# Patient Record
Sex: Male | Born: 1980 | Hispanic: No | Marital: Married | Smoking: Current every day smoker
Health system: Southern US, Community
[De-identification: ages and names within clinical notes are randomized; demographics above are authoritative.]

## PROBLEM LIST (undated history)

## (undated) DIAGNOSIS — K529 Noninfective gastroenteritis and colitis, unspecified: Secondary | ICD-10-CM

---

## 2009-03-16 ENCOUNTER — Encounter: Admission: RE | Admit: 2009-03-16 | Discharge: 2009-03-16 | Payer: Self-pay | Admitting: Infectious Diseases

## 2013-07-18 ENCOUNTER — Encounter (HOSPITAL_COMMUNITY): Payer: Self-pay | Admitting: Emergency Medicine

## 2013-07-18 ENCOUNTER — Emergency Department (HOSPITAL_COMMUNITY)
Admission: EM | Admit: 2013-07-18 | Discharge: 2013-07-18 | Disposition: A | Discharge status: Home / Self Care | Payer: BC Managed Care – PPO | Attending: Emergency Medicine | Admitting: Emergency Medicine

## 2013-07-18 ENCOUNTER — Emergency Department (HOSPITAL_COMMUNITY): Payer: BC Managed Care – PPO

## 2013-07-18 DIAGNOSIS — R509 Fever, unspecified: Secondary | ICD-10-CM | POA: Insufficient documentation

## 2013-07-18 DIAGNOSIS — Z8719 Personal history of other diseases of the digestive system: Secondary | ICD-10-CM | POA: Insufficient documentation

## 2013-07-18 DIAGNOSIS — R109 Unspecified abdominal pain: Secondary | ICD-10-CM | POA: Insufficient documentation

## 2013-07-18 DIAGNOSIS — K529 Noninfective gastroenteritis and colitis, unspecified: Secondary | ICD-10-CM

## 2013-07-18 DIAGNOSIS — R112 Nausea with vomiting, unspecified: Secondary | ICD-10-CM | POA: Insufficient documentation

## 2013-07-18 LAB — URINALYSIS, ROUTINE W REFLEX MICROSCOPIC
Ketones, ur: NEGATIVE mg/dL
Leukocytes, UA: NEGATIVE
Nitrite: NEGATIVE
Protein, ur: NEGATIVE mg/dL
Urobilinogen, UA: 0.2 mg/dL (ref 0.0–1.0)
pH: 6.5 (ref 5.0–8.0)

## 2013-07-18 LAB — CBC WITH DIFFERENTIAL/PLATELET
Basophils Absolute: 0 10*3/uL (ref 0.0–0.1)
Basophils Relative: 0 % (ref 0–1)
Eosinophils Absolute: 0 10*3/uL (ref 0.0–0.7)
HCT: 46.7 % (ref 39.0–52.0)
MCHC: 33.2 g/dL (ref 30.0–36.0)
Monocytes Absolute: 0.8 10*3/uL (ref 0.1–1.0)
Neutro Abs: 14.5 10*3/uL — ABNORMAL HIGH (ref 1.7–7.7)
Neutrophils Relative %: 87 % — ABNORMAL HIGH (ref 43–77)
RDW: 13 % (ref 11.5–15.5)

## 2013-07-18 LAB — HEPATIC FUNCTION PANEL
AST: 24 U/L (ref 0–37)
Bilirubin, Direct: 0.1 mg/dL (ref 0.0–0.3)
Indirect Bilirubin: 0.5 mg/dL (ref 0.3–0.9)
Total Bilirubin: 0.6 mg/dL (ref 0.3–1.2)

## 2013-07-18 LAB — BASIC METABOLIC PANEL
Chloride: 94 mEq/L — ABNORMAL LOW (ref 96–112)
Creatinine, Ser: 0.99 mg/dL (ref 0.50–1.35)
GFR calc Af Amer: >90 mL/min (ref >90)
GFR calc non Af Amer: >90 mL/min (ref >90)

## 2013-07-18 LAB — LIPASE, BLOOD: Lipase: 21 U/L (ref 11–59)

## 2013-07-18 MED ORDER — SODIUM CHLORIDE 0.9 % IV BOLUS (SEPSIS)
1000.0000 mL | Freq: Once | INTRAVENOUS | Status: AC
  Administered 2013-07-18: 1000 mL via INTRAVENOUS

## 2013-07-18 MED ORDER — IOHEXOL 300 MG/ML  SOLN
50.0000 mL | Freq: Once | INTRAMUSCULAR | Status: AC | PRN
  Administered 2013-07-18: 50 mL via ORAL

## 2013-07-18 MED ORDER — METRONIDAZOLE IN NACL 5-0.79 MG/ML-% IV SOLN
500.0000 mg | Freq: Once | INTRAVENOUS | Status: AC
  Administered 2013-07-18: 500 mg via INTRAVENOUS
  Filled 2013-07-18: qty 100

## 2013-07-18 MED ORDER — IOHEXOL 300 MG/ML  SOLN
100.0000 mL | Freq: Once | INTRAMUSCULAR | Status: AC | PRN
  Administered 2013-07-18: 100 mL via INTRAVENOUS

## 2013-07-18 MED ORDER — ACETAMINOPHEN 325 MG PO TABS
650.0000 mg | ORAL_TABLET | Freq: Once | ORAL | Status: AC
  Administered 2013-07-18: 650 mg via ORAL
  Filled 2013-07-18: qty 2

## 2013-07-18 MED ORDER — CIPROFLOXACIN IN D5W 400 MG/200ML IV SOLN
400.0000 mg | Freq: Once | INTRAVENOUS | Status: AC
  Administered 2013-07-18: 400 mg via INTRAVENOUS
  Filled 2013-07-18 (×2): qty 200

## 2013-07-18 MED ORDER — CIPROFLOXACIN HCL 500 MG PO TABS: 500.0000 mg | ORAL_TABLET | Freq: Two times a day (BID) | ORAL | Status: AC

## 2013-07-18 MED ORDER — SODIUM CHLORIDE 0.9 % IV SOLN
Freq: Once | INTRAVENOUS | Status: AC
  Administered 2013-07-18: 17:00:00 via INTRAVENOUS

## 2013-07-18 MED ORDER — PREDNISONE 20 MG PO TABS: ORAL_TABLET | ORAL | Status: AC

## 2013-07-18 MED ORDER — METRONIDAZOLE 500 MG PO TABS: 500.0000 mg | ORAL_TABLET | Freq: Three times a day (TID) | ORAL | Status: AC

## 2013-07-18 NOTE — ED Provider Notes (Signed)
CSN: [id number]     Arrival date & time 07/18/13  1124 History   First MD Initiated Contact with Patient 07/18/13 1302     Chief Complaint  Patient presents with  . Abdominal Pain  . Nausea  . Emesis   (Consider location/radiation/quality/duration/timing/severity/associated sxs/prior Treatment) The history is provided by the patient and medical records. The history is limited by a language barrier. A language interpreter was used.   This is a 32 year old male with no significant past history presented to the ED from urgent care for further evaluation of abdominal pain.  Patient began having generalized abdominal pain, nausea, and fever 2 days ago. Pain is now localized to periumbilical, RLQ, and LLQ. Patient states nausea has resolved.  No vomiting.  No recent sick contacts.  Endorses subjective fever and chills.  No prior abdominal surgeries.  Not on any daily medications.  Pain rated 1-2 on arrival to the ED.  VS stable.  History reviewed. No pertinent past medical history. History reviewed. No pertinent past surgical history. No family history on file. History  Substance Use Topics  . Smoking status: Not on file  . Smokeless tobacco: Not on file  . Alcohol Use: Not on file    Review of Systems  Constitutional: Positive for fever.  Gastrointestinal: Positive for abdominal pain.  All other systems reviewed and are negative.    Allergies  Review of patient's allergies indicates no known allergies.  Home Medications   Current Outpatient Rx  Name  Route  Sig  Dispense  Refill  . DM-Doxylamine-Acetaminophen (NYQUIL COLD & FLU PO)   Oral   Take 2 capsules by mouth at bedtime.          BP 119/73  Pulse 107  Temp(Src) 99.1 F (37.3 C) (Oral)  Resp 20  SpO2 100%  Physical Exam  Nursing note and vitals reviewed. Constitutional: He is oriented to person, place, and time. He appears well-developed and well-nourished. No distress.  Warm to touch  HENT:  Head:  Normocephalic and atraumatic.  Mouth/Throat: Oropharynx is clear and moist.  Eyes: Conjunctivae and EOM are normal. Pupils are equal, round, and reactive to light.  Neck: Normal range of motion.  Cardiovascular: Normal rate, regular rhythm and normal heart sounds.   Pulmonary/Chest: Effort normal and breath sounds normal. No respiratory distress. He has no wheezes.  Abdominal: Soft. Bowel sounds are normal. There is tenderness in the right lower quadrant and left lower quadrant. There is no CVA tenderness.  Abdomen soft, non-distended, TTP RLQ and LLQ  Musculoskeletal: Normal range of motion.  Neurological: He is alert and oriented to person, place, and time.  Skin: Skin is warm and dry. He is not diaphoretic.  Psychiatric: He has a normal mood and affect.    ED Course  Procedures (including critical care time) Labs Review Labs Reviewed  CBC WITH DIFFERENTIAL - Abnormal; Notable for the following:    WBC 16.7 (*)    RBC 5.91 (*)    Neutrophils Relative % 87 (*)    Neutro Abs 14.5 (*)    Lymphocytes Relative 9 (*)    All other components within normal limits  BASIC METABOLIC PANEL - Abnormal; Notable for the following:    Sodium 134 (*)    Chloride 94 (*)    Glucose, Bld 117 (*)    All other components within normal limits  HEPATIC FUNCTION PANEL - Abnormal; Notable for the following:    Total Protein 8.8 (*)    All other  components within normal limits  URINALYSIS, ROUTINE W REFLEX MICROSCOPIC  LIPASE, BLOOD   Imaging Review No results found.  EKG Interpretation   None       MDM  No diagnosis found.  Labs as above, leukocytosis at 16.7 with left shift.  CT abd/pelvis pending at this time.  Care signed out to PA Sciacca-- will follow results and dispo appropriately.  Garlon Hatchet, PA-C 07/18/13 [phone]

## 2013-07-18 NOTE — ED Provider Notes (Signed)
Medical screening examination/treatment/procedure(s) were conducted as a shared visit with non-physician practitioner(s) and myself.  I personally evaluated the patient during the encounter.  EKG Interpretation   None       Given abdominal pain the patient CT scan performed to evaluate further for pathology  Lyanne Co, MD 07/18/13 [phone]

## 2013-07-18 NOTE — ED Provider Notes (Signed)
Discussed case with Sharilyn Sites, PA-C. Transfer of care from Sharilyn Sites, PA-C at change in shift.  Miguel Stone is a 32 y/o M with no significant past medical history presenting to the ED with abdominal pain, nausea, and emesis.   Urine negative for infection - negative nitrites, pyuria, or leukocytes identified. CBC noted elevated WBC of 16.7 with a left shift. CMP noted mild hyponatremia of 134. Liver panel negative findings. Lipase negative elevation. CT scan of abdomen and pelvis noted colitis involving the mid to distal ascending colon and proximal portion of the transverse colon - appendix normal, negative findings for abscess.  Patient placed on IV fluids, IV cipro, and IV flagyl Negative findings for appendicitis.  Results for orders placed during the hospital encounter of 07/18/13  CBC WITH DIFFERENTIAL      Result Value Range   WBC 16.7 (*) 4.0 - 10.5 K/uL   RBC 5.91 (*) 4.22 - 5.81 MIL/uL   Hemoglobin 15.5  13.0 - 17.0 g/dL   HCT 16.1  09.6 - 04.5 %   MCV 79.0  78.0 - 100.0 fL   MCH 26.2  26.0 - 34.0 pg   MCHC 33.2  30.0 - 36.0 g/dL   RDW 40.9  81.1 - 91.4 %   Platelets 268  150 - 400 K/uL   Neutrophils Relative % 87 (*) 43 - 77 %   Neutro Abs 14.5 (*) 1.7 - 7.7 K/uL   Lymphocytes Relative 9 (*) 12 - 46 %   Lymphs Abs 1.4  0.7 - 4.0 K/uL   Monocytes Relative 5  3 - 12 %   Monocytes Absolute 0.8  0.1 - 1.0 K/uL   Eosinophils Relative 0  0 - 5 %   Eosinophils Absolute 0.0  0.0 - 0.7 K/uL   Basophils Relative 0  0 - 1 %   Basophils Absolute 0.0  0.0 - 0.1 K/uL  BASIC METABOLIC PANEL      Result Value Range   Sodium 134 (*) 135 - 145 mEq/L   Potassium 4.2  3.5 - 5.1 mEq/L   Chloride 94 (*) 96 - 112 mEq/L   CO2 26  19 - 32 mEq/L   Glucose, Bld 117 (*) 70 - 99 mg/dL   BUN 18  6 - 23 mg/dL   Creatinine, Ser 7.82  0.50 - 1.35 mg/dL   Calcium 9.8  8.4 - 95.6 mg/dL   GFR calc non Af Amer >90  >90 mL/min   GFR calc Af Amer >90  >90 mL/min  URINALYSIS, ROUTINE W REFLEX  MICROSCOPIC      Result Value Range   Color, Urine YELLOW  YELLOW   APPearance CLEAR  CLEAR   Specific Gravity, Urine 1.020  1.005 - 1.030   pH 6.5  5.0 - 8.0   Glucose, UA NEGATIVE  NEGATIVE mg/dL   Hgb urine dipstick NEGATIVE  NEGATIVE   Bilirubin Urine NEGATIVE  NEGATIVE   Ketones, ur NEGATIVE  NEGATIVE mg/dL   Protein, ur NEGATIVE  NEGATIVE mg/dL   Urobilinogen, UA 0.2  0.0 - 1.0 mg/dL   Nitrite NEGATIVE  NEGATIVE   Leukocytes, UA NEGATIVE  NEGATIVE  HEPATIC FUNCTION PANEL      Result Value Range   Total Protein 8.8 (*) 6.0 - 8.3 g/dL   Albumin 4.3  3.5 - 5.2 g/dL   AST 24  0 - 37 U/L   ALT 42  0 - 53 U/L   Alkaline Phosphatase 89  39 - 117 U/L  Total Bilirubin 0.6  0.3 - 1.2 mg/dL   Bilirubin, Direct 0.1  0.0 - 0.3 mg/dL   Indirect Bilirubin 0.5  0.3 - 0.9 mg/dL  LIPASE, BLOOD      Result Value Range   Lipase 21  11 - 59 U/L   Ct Abdomen Pelvis W Contrast  07/18/2013   CLINICAL DATA:  Right lower quadrant pain.  EXAM: CT ABDOMEN AND PELVIS WITH CONTRAST  TECHNIQUE: Multidetector CT imaging of the abdomen and pelvis was performed using the standard protocol following bolus administration of intravenous contrast.  CONTRAST:  OMNIPAQUE IOHEXOL 300 MG/ML  SOLN  COMPARISON:  None.  FINDINGS: Hypoventilation is appreciated within the lung bases.  The liver, spleen, adrenals, pancreas, kidneys are unremarkable.  There is diffuse bowel wall thickening involving the mid to distal ascending colon extending into the hepatic flexure and proximal transverse colon regions. Inflammatory changes identified within the surrounding mesenteric fat. There is no evidence of free fluid or loculated fluid collections. The appendix is appreciated images 57 through 61 series 2 and is unremarkable. Small subcentimeter lymph nodes are identified within the right pericolic gutter region. Subcentimeter lymph nodes are also appreciated adjacent to the distal aspect of the ascending colon. There is no  evidence of associated loculated fluid collections, nor pneumoperitoneum. A small amount of stool is appreciated within the colon.  The celiac, SMA, IMA, portal vein, SMV are opacified. There is no evidence of abdominal aortic aneurysm nor dissection. There is no evidence of bowel obstruction. There is no evidence of abdominal or pelvic masses no pathologic sized adenopathy. There is mild diverticulosis within the sigmoid colon. There is no evidence of pelvic free fluid or loculated fluid collections.  IMPRESSION: Colitis involving the mid to distal ascending colon and proximal portion of the transverse colon. Differential considerations are infectious versus inflammatory. Small reactive lymph nodes are identified adjacent to areas of colon. There is no evidence of pneumoperitoneum no evidence raise concern of abscess. The appendix is appreciated and is unremarkable.   Electronically Signed   By: Salome Holmes M.D.   On: 07/18/2013 15:52    5:59 PM This provider spoke with attending physician regarding case, presentation, history, labs and imaging. Dr. Earna Coder recommended clear liquid, PO challenge, to see if patient can tolerate, if patient stable and can tolerate PO can be discharged. If patient cannot tolerate - patient to be admitted.   7:59 PM IV medications and fluids completed. Patient tolerated fluids PO. This provider re-assessed patient. Translator used, [place] Patient reported that he is feeling better. Discussed with patient labs and imaging. Educated patient on what to watch out for. Discussed with patient discharge instructions. Discussed with patient who to follow-up with. Gave strict return instructions to patient. BS normoactive in all quadrants. Negative pain upon palpation - negative guarding noted. Negative acute abdomen. Vitals re-checked, temperature of 102.9 degrees fahrenheit - secondary to fever, suspicion to be higher for infectious then ischemic course. Discussed with Dr. Earna Coder case - as per physician cleared patient for discharge. Discussed with patient to stick with clear diet. Discussed with patient to take Tylenol as needed for fever control. Discussed with patient to take antibiotics as prescribed. Referred to GI. Discussed with patient to closely monitor symptoms and if symptoms are to worsen or change to report back to the ED - strict instructions given. Patient agreed to plan of care, understood, all questions answered.   Medications  acetaminophen (TYLENOL) tablet 650 mg (not administered)  iohexol (OMNIPAQUE) 300 MG/ML solution 50 mL (50 mLs Oral Contrast Given 07/18/13 1356)  iohexol (OMNIPAQUE) 300 MG/ML solution 100 mL (100 mLs Intravenous Contrast Given 07/18/13 1527)  sodium chloride 0.9 % bolus 1,000 mL (0 mLs Intravenous Stopped 07/18/13 1725)  ciprofloxacin (CIPRO) IVPB 400 mg (0 mg Intravenous Stopped 07/18/13 1846)  metroNIDAZOLE (FLAGYL) IVPB 500 mg (0 mg Intravenous Stopped 07/18/13 1745)  0.9 %  sodium chloride infusion ( Intravenous New Bag/Given 07/18/13 1640)   Filed Vitals:   07/18/13 1220 07/18/13 1547 07/18/13 1917 07/18/13 1954  BP: 119/73 132/63 119/65 118/70  Pulse: 107 107 108 115  Temp: 99.1 F (37.3 C) 98.9 F (37.2 C) 98.7 F (37.1 C) 102.9 F (39.4 C)  TempSrc: Oral Oral Oral Oral  Resp: 20 19 18 22   SpO2: 100% 99% 100% 98%   Diagnoses that have been ruled out:  None  Diagnoses that are still under consideration:  None  Final diagnoses:  Colitis     Raymon Mutton, PA-C 07/19/13 1439

## 2013-07-18 NOTE — ED Notes (Signed)
Pt is brought in sent over from urgent care with abd pain n/v fever they want [place] to R/O apendicitis.

## 2013-07-18 NOTE — ED Notes (Signed)
He will call when he has to urinate.

## 2013-07-20 ENCOUNTER — Emergency Department (HOSPITAL_COMMUNITY)
Admission: EM | Admit: 2013-07-20 | Discharge: 2013-07-20 | Discharge status: Against Medical Advice | Payer: BC Managed Care – PPO | Attending: Emergency Medicine | Admitting: Emergency Medicine

## 2013-07-20 ENCOUNTER — Encounter: Payer: Self-pay | Admitting: Internal Medicine

## 2013-07-20 ENCOUNTER — Encounter (HOSPITAL_COMMUNITY): Payer: Self-pay | Admitting: Emergency Medicine

## 2013-07-20 DIAGNOSIS — Z139 Encounter for screening, unspecified: Secondary | ICD-10-CM

## 2013-07-20 DIAGNOSIS — R197 Diarrhea, unspecified: Secondary | ICD-10-CM | POA: Insufficient documentation

## 2013-07-20 DIAGNOSIS — Z8719 Personal history of other diseases of the digestive system: Secondary | ICD-10-CM | POA: Insufficient documentation

## 2013-07-20 HISTORY — DX: Noninfective gastroenteritis and colitis, unspecified: K52.9

## 2013-07-20 NOTE — Progress Notes (Signed)
   CARE MANAGEMENT ED NOTE 07/20/2013  Patient:  RAYWOOD, WAILES   Account Number:  [id number]  Date Initiated:  07/20/2013  Documentation initiated by:  Edd Arbour  Subjective/Objective Assessment:   32 yr old male bcbs [place] ppo with 2 ed visits in last month for abdominal pain Colitis found during 07/18/09 ED visit Pt reports taking in liquid foods     Subjective/Objective Assessment Detail:   Beverley Fiedler, MD On 08/24/2013 your appointment is on August 24 2013 at 8:30 in the morning with Dr Vonna Kotyk [place]  [place] [address] [phone]     Action/Plan:   CM spoke with ED RN Renita Cm spoke with ED charge RN Victorino Dike CM spoke with pt using interpreter vietmanese services to review d/c plans for f/u appt with Dr Rhea Belton   Action/Plan Detail:   offered pt list of nutritionist but he states he would like to try increasing his diet himself and call Cm as needed Cm provided him with her contact number   Anticipated DC Date:  07/20/2013     Status Recommendation to Physician:   Result of Recommendation:    Other ED Services  Consult Working Plan    DC Planning Services  Other  Outpatient Services - Pt will follow up    Choice offered to / List presented to:            Status of service:  Completed, signed off  ED Comments:   ED Comments Detail:

## 2013-07-20 NOTE — ED Notes (Addendum)
Pt was seen 12/8 and diagnosed with Colitis.  Pt has returned to see what kind of diet he should be eating.  Pt was confused and thought the GI doctor was at this facility.  Through the interpreter phone, this RN informed patient that the address is on his D/C paperwork and marked it for him.       Pt speaks very little [place].

## 2013-07-20 NOTE — ED Provider Notes (Signed)
MSE was initiated and I personally evaluated the patient and placed orders (if any) at  12:38 PM on July 20, 2013.  History was provided by the patient and medical records. Language interpreter was used. The patient was evaluated in the ED on 07/18/2013 and was instructed to follow up with GI.  He thought he was suppose to come back to the ED to be seen by the GI specialist.  I instructed him to follow up with the GI specialist listed on his AVS. He states he has had some chicken broth and rice.  Loose BM this morning.  He reports his symptoms have resolved.  Denies fever or chill, nausea, vomiting, constipation.  PE: Well appearing, NAD, VSS, afebrile Cardio: Normal rate normal rhythm Pulmonology: Lung clear no wheezing, or decreased breath sounds. Abdomen: normal bowel sounds, soft, non-tender to palpation.  Discussed number he needed to call for a follow up appointment with Post GI.  He reports understanding and agrees to the plan.  He is stable for discharge.  Clabe Seal, PA-C 07/22/13 1550

## 2013-07-22 NOTE — ED Provider Notes (Signed)
Medical screening examination/treatment/procedure(s) were performed by non-physician practitioner and as supervising physician I was immediately available for consultation/collaboration.  EKG Interpretation   None          Evertte Sones J. Yulianna Folse, MD 07/22/13 1927 

## 2013-07-23 NOTE — ED Provider Notes (Signed)
Medical screening examination/treatment/procedure(s) were performed by non-physician practitioner and as supervising physician I was immediately available for consultation/collaboration.  EKG Interpretation   None         Junius Argyle, MD 07/23/13 [phone]

## 2013-08-24 ENCOUNTER — Ambulatory Visit: Payer: BC Managed Care – PPO | Admitting: Internal Medicine

## 2014-06-18 ENCOUNTER — Encounter (HOSPITAL_COMMUNITY): Payer: Self-pay | Admitting: Emergency Medicine

## 2014-06-18 ENCOUNTER — Emergency Department (HOSPITAL_COMMUNITY)
Admission: EM | Admit: 2014-06-18 | Discharge: 2014-06-18 | Disposition: A | Discharge status: Home / Self Care | Payer: BC Managed Care – PPO | Attending: Emergency Medicine | Admitting: Emergency Medicine

## 2014-06-18 ENCOUNTER — Emergency Department (HOSPITAL_COMMUNITY): Payer: BC Managed Care – PPO

## 2014-06-18 DIAGNOSIS — M62830 Muscle spasm of back: Secondary | ICD-10-CM | POA: Diagnosis not present

## 2014-06-18 DIAGNOSIS — M545 Low back pain, unspecified: Secondary | ICD-10-CM

## 2014-06-18 DIAGNOSIS — Z7952 Long term (current) use of systemic steroids: Secondary | ICD-10-CM | POA: Diagnosis not present

## 2014-06-18 DIAGNOSIS — Z79899 Other long term (current) drug therapy: Secondary | ICD-10-CM | POA: Diagnosis not present

## 2014-06-18 DIAGNOSIS — Z8719 Personal history of other diseases of the digestive system: Secondary | ICD-10-CM | POA: Insufficient documentation

## 2014-06-18 DIAGNOSIS — Z720 Tobacco use: Secondary | ICD-10-CM | POA: Insufficient documentation

## 2014-06-18 DIAGNOSIS — Z792 Long term (current) use of antibiotics: Secondary | ICD-10-CM | POA: Insufficient documentation

## 2014-06-18 MED ORDER — HYDROCODONE-ACETAMINOPHEN 5-325 MG PO TABS: 1.0000 | ORAL_TABLET | ORAL | Status: AC | PRN

## 2014-06-18 MED ORDER — KETOROLAC TROMETHAMINE 60 MG/2ML IM SOLN
60.0000 mg | Freq: Once | INTRAMUSCULAR | Status: AC
  Administered 2014-06-18: 60 mg via INTRAMUSCULAR
  Filled 2014-06-18: qty 2

## 2014-06-18 MED ORDER — CYCLOBENZAPRINE HCL 5 MG PO TABS: 5.0000 mg | ORAL_TABLET | Freq: Two times a day (BID) | ORAL | Status: AC | PRN

## 2014-06-18 MED ORDER — NAPROXEN 375 MG PO TABS: 375.0000 mg | ORAL_TABLET | Freq: Two times a day (BID) | ORAL | Status: AC

## 2014-06-18 NOTE — ED Provider Notes (Signed)
CSN: [id number]     Arrival date & time 06/18/14  [id number] History   First MD Initiated Contact with Patient 06/18/14 1023     Chief Complaint  Patient presents with  . Back Pain  . Leg Pain     (Consider location/radiation/quality/duration/timing/severity/associated sxs/prior Treatment) HPI   Patient to the ER with complaints of bilateral low back pain. He works at a [employer] and reports bending over frequently for his job. He says his back feels tight. He was seen at an Urgent Care and given Baclofen and an NSAID. He says it does help some but the pain persists. He denies having numbness or weakness in his legs. He has no had any dysuria. Denies cp, head aches, SOB, weakness, chills. Pt over all appears well  Past Medical History  Diagnosis Date  . Colitis    History reviewed. No pertinent past surgical history. No family history on file. History  Substance Use Topics  . Smoking status: Current Every Day Smoker    Types: Cigarettes  . Smokeless tobacco: Not on file  . Alcohol Use: No    Review of Systems  10 Systems reviewed and are negative for acute change except as noted in the HPI.     Allergies  Review of patient's allergies indicates no known allergies.  Home Medications   Prior to Admission medications   Medication Sig Start Date End Date Taking? Authorizing Provider  ciprofloxacin (CIPRO) 500 MG tablet Take 1 tablet (500 mg total) by mouth 2 (two) times daily. One po bid x 7 days 07/18/13   Marissa Sciacca, PA-C  metroNIDAZOLE (FLAGYL) 500 MG tablet Take 1 tablet (500 mg total) by mouth 3 (three) times daily. One po bid x 7 days 07/18/13   Marissa Sciacca, PA-C  predniSONE (DELTASONE) 20 MG tablet 3 tabs po day one, then 2 tabs daily x 4 days 07/18/13   Marissa Sciacca, PA-C   BP 132/79 mmHg  Pulse 69  Temp(Src) 97.3 F (36.3 C) (Oral)  Resp 17  Ht 5\' 7"  (1.702 m)  Wt 150 lb (68.04 kg)  BMI 23.49 kg/m2  SpO2 100% Physical Exam  Constitutional: He appears  well-developed and well-nourished. No distress.  HENT:  Head: Normocephalic and atraumatic.  Eyes: Pupils are equal, round, and reactive to light.  Neck: Normal range of motion. Neck supple.  Cardiovascular: Normal rate and regular rhythm.   Pulmonary/Chest: Effort normal.  Abdominal: Soft.  Musculoskeletal:       Back:  Pt has equal strength to bilateral lower extremities.  Neurosensory function adequate to both legs No clonus on dorsiflextion Skin color is normal. Skin is warm and moist.  I see no step off deformity, no midline bony tenderness.  Pt is able to ambulate.  No crepitus, laceration, effusion, induration, lesions, swelling.   Pedal pulses are symmetrical and palpable bilaterally  moderate tenderness to palpation of paraspinel muscles   Neurological: He is alert.  Skin: Skin is warm and dry.  Nursing note and vitals reviewed.   ED Course  Procedures (including critical care time) Labs Review Labs Reviewed - No data to display  Imaging Review Dg Lumbar Spine Complete  06/18/2014   CLINICAL DATA:  33 year old male with lower back pain radiating into the bilateral legs. No known injury.  EXAM: LUMBAR SPINE - COMPLETE 4+ VIEW  COMPARISON:  CT abdomen/ pelvis 07/18/2013  FINDINGS: No evidence of acute fracture or malalignment. Vertebral body heights and intervertebral disc spaces are maintained. There is straightening  of the normal lumbar lordosis. Normal bony mineralization. No lytic of blastic osseous lesion. No focal soft tissue abnormality. Visualized bowel gas pattern is unremarkable.  IMPRESSION: 1. No evidence of acute osseous abnormality or significant degenerative change. 2. Straightening of the normal lumbar lordosis may be positional or related to underlying muscle spasm.   Electronically Signed   By: Malachy MoanHeath  McCullough M.D.   On: 06/18/2014 10:41     EKG Interpretation None      MDM   Final diagnoses:  Low back pain    I recommend massage, heat and  ice. Stretching, rest and follow-up with orthopedics. I will change his medications to try stronger pain control.  33 y.o.Geno Waterson's  with back pain. No neurological deficits and normal neuro exam. Patient can walk. No loss of bowel or bladder control. No concern for cauda equina at this time base on HPI and physical exam findings. No fever, night sweats, weight loss, h/o cancer, IVDU.   RICE protocol and pain medicine indicated and discussed with patient.   Patient Plan 1. Medications: pain medication and muscle relaxer. Cont usual home medications unless otherwise directed. 2. Treatment: rest, drink plenty of fluids, gentle stretching as discussed, alternate ice and heat  3. Follow Up: Please followup with your primary doctor for discussion of your diagnoses and further evaluation after today's visit; if you do not have a primary care doctor use the resource guide provided to find one  Advised to follow-up with the orthopedist if symptoms do not start to resolve in the next 2-3 days. If develop loss of bowel or urinary control return to the ED as soon as possible for further evaluation. To take the medications as prescribed as they can cause harm if not taken appropriately.   Vital signs are stable at discharge. Filed Vitals:   06/18/14 1110  BP: 121/80  Pulse: 76  Temp:   Resp: 16    Patient/guardian has voiced understanding and agreed to follow-up with the PCP or specialist.         Dorthula Matasiffany G Therin Vetsch, PA-C 06/18/14 1148  Toy CookeyMegan Docherty, MD 06/18/14 1550

## 2014-06-18 NOTE — Discharge Instructions (Signed)
C?ng th?t l?ng cng (Lumbosacral Strain) C?ng vng th?t l?ng cng l tnh tr?ng c?ng b?t k? b? ph?n no t?o nn nh?ng ??t s?ng th?t l?ng cng c?a qu v?. Cc ??t s?ng th?t l?ng cng c?a qu v? l nh?ng ph?n x??ng t?o nn m?t ph?n ba pha d??i x??ng s?ng. Cc ??t s?ng th?t l?ng cng c?a qu v? ???c g?n v?i nhau b?ng cc c? v m x? ch?c ch?n (dy ch?ng).  NGUYN NHN.  M?t c ?nh b?t ng? vo l?ng c th? gy c?ng th?t l?ng cng. Ngoi ra, b?t k? ?i?u g lm c?ng cc c? ? th?t l?ng qu m?c c?ng c th? gy ra ch?ng c?ng th?t l?ng cng ny. Tnh tr?ng ny th??ng th?y ? nh?ng ng??i g?ng s?c qu m?c, ng, nng v?t n?ng, g?p ng??i, ho?c ci xu?ng l?p ?i l?p l?i nhi?u l?n. CC Y?U T? NGUY C?  Cng vi?c ?i h?i s?c l?c.  Tham gia vo cc mn th? thao ??y ho?c ko ?i h?i ph?i v?n l?ng ??t ng?t (qu?n v?t, ?nh gn, bng chy).  Nng t?.  U?n cong qu m?c ph?n th?t l?ng.  Khung x??ng ch?u nghing v? pha tr??c.  Y?u c? l?ng ho?c y?u c? b?ng ho?c c? hai.  Gn kheo c?ng. D?U HI?U V TRI?U CH?NG  C?ng th?t l?ng cng c th? gy ?au ? vng b? ch?n th??ng ho?c c?n ?au di chuy?n (lan t?a) xu?ng chn qu v?.  CH?N ?ON Chuyn gia ch?m [place] s?c kh?e th??ng c th? ch?n ?on c?ng th?t l?ng cng b?ng cch khm th?c th?Suzzette Righter m?t s? tr??ng h?p qu v? c th? c?n cc ki?m tra nh? ch?p X quang.  ?I?U TR?  Vi?c ?i?u tr? ch?n th??ng vng th?t l?ng ty thu?c vo nhi?u y?u t? m bc s? lm sng s? ph?i ?nh gi. Tuy nhin, h?u h?t vi?c ?i?u tr? s? bao g?m vi?c s? d?ng thu?c ch?ng vim. H??NG D?N CH?M [place] T?I NH   Trnh nh?ng ho?t ??ng th? ch?t n?ng (qu?n v?t, racquetball, l??t vn n??c) n?u qu v? khng c ?? tnh tr?ng th? l?c ?? th?c hi?n cc ho?t ??ng ?. ?i?u ny c th? lm tr?m tr?ng thm ho?c gy ra v?n ??.  N?u qu v? c m?t v?n ?? ? l?ng, hy trnh nh?ng mn th? thao ?i h?i ph?i c? ??ng c? th? ??t ng?t. B?i v ?i b? th??ng l nh?ng ho?t ??ng an ton h?n.  Duy tr t? th? thch h?p.  Duy tr cn n?ng c  l?i cho s?c kh?e.  ??i v?i nh?ng tnh tr?ng c?p tnh, qu v? c th? ch??m ? l?nh ln vng b? th??ng.  Cho ? l?nh vo ti nh?a.  ?? kh?n t?m vo gi?a da v ti.  Ch??m ? l?nh ln vng b? th??ng trong kho?ng 20 pht, 2 - 3 l?n m?i ngy.  Khi vng th?t l?ng b?t ??u lnh, c th? t?p cc bi t?p ko c?ng ho?c t?ng c??ng s?c kh?e. ?I K[facility] N?U:  ?au l?ng tr? nn t? h?n.  Qu v? b? ?au l?ng n?ng v khng ?? khi dng thu?c. NGAY L?P T?C ?I [facility] N?U:   Qu v? b? t, ?au bu?t, y?u, ho?c cc v?n ?? khi c? ??ng tay ho?c chn.  C s? thay ??i trong vi?c ki?m sot ??i ti?n ho?c ti?u ti?n.  Qu v? c c?n ?au t?ng ln ? b?t k? vng no c?a c? th?, k? c? ? vng b?ng (b?ng).  Qu v? th?y kh th?,  chng m?t, ho?c c?m th?y mu?n ng?t.  Qu v? c?m th?y kh ch?u ? d? dy (bu?n nn), nn (nn m?a), ho?c ?? m? hi.  Qu v? th?y ??i m?u ngn chn ho?c chn, ho?c bn chn qu v? r?t l?nh. ??M B?O QU V?:   Hi?u r cc h??ng d?n ny.  S? theo di tnh tr?ng c?a mnh.  S? yu c?u tr? gip ngay l?p t?c n?u qu v? c?m th?y khng kh?e ho?c th?y tr?m tr?ng h?n. Document Released: 05/07/2005 Document Revised: 08/02/2013 [facility] Patient Information 2015 [place], [place]. This information is not intended to replace advice given to you by your health care provider. Make sure you discuss any questions you have with your health care provider. Low Back Strain with Rehab A strain is an injury in which a tendon or muscle is torn. The muscles and tendons of the lower back are vulnerable to strains. However, these muscles and tendons are very strong and require a great force to be injured. Strains are classified into three categories. Grade 1 strains cause pain, but the tendon is not lengthened. Grade 2 strains include a lengthened ligament, due to the ligament being stretched or partially ruptured. With grade 2 strains there is still function, although the function may be decreased. Grade 3 strains involve a complete  tear of the tendon or muscle, and function is usually impaired. SYMPTOMS   Pain in the lower back.  Pain that affects one side more than the other.  Pain that gets worse with movement and may be felt in the hip, buttocks, or back of the thigh.  Muscle spasms of the muscles in the back.  Swelling along the muscles of the back.  Loss of strength of the back muscles.  Crackling sound (crepitation) when the muscles are touched. CAUSES  Lower back strains occur when a force is placed on the muscles or tendons that is greater than they can handle. Common causes of injury include:  Prolonged overuse of the muscle-tendon units in the lower back, usually from incorrect posture.  A single violent injury or force applied to the back. RISK INCREASES WITH:  Sports that involve twisting forces on the spine or a lot of bending at the waist (football, rugby, weightlifting, bowling, golf, tennis, speed skating, racquetball, swimming, running, gymnastics, diving).  Poor strength and flexibility.  Failure to warm up properly before activity.  Family history of lower back pain or disk disorders.  Previous back injury or surgery (especially fusion).  Poor posture with lifting, especially heavy objects.  Prolonged sitting, especially with poor posture. PREVENTION   Learn and use proper posture when sitting or lifting (maintain proper posture when sitting, lift using the knees and legs, not at the waist).  Warm up and stretch properly before activity.  Allow for adequate recovery between workouts.  Maintain physical fitness:  Strength, flexibility, and endurance.  Cardiovascular fitness. PROGNOSIS  If treated properly, lower back strains usually heal within 6 weeks. RELATED COMPLICATIONS   Recurring symptoms, resulting in a chronic problem.  Chronic inflammation, scarring, and partial muscle-tendon tear.  Delayed healing or resolution of symptoms.  Prolonged  disability. TREATMENT  Treatment first involves the use of ice and medicine, to reduce pain and inflammation. The use of strengthening and stretching exercises may help reduce pain with activity. These exercises may be performed at home or with a therapist. Severe injuries may require referral to a therapist for further evaluation and treatment, such as ultrasound. Your caregiver may advise that you  wear a back brace or corset, to help reduce pain and discomfort. Often, prolonged bed rest results in greater harm then benefit. Corticosteroid injections may be recommended. However, these should be reserved for the most serious cases. It is important to avoid using your back when lifting objects. At night, sleep on your back on a firm mattress with a pillow placed under your knees. If non-surgical treatment is unsuccessful, surgery may be needed.  MEDICATION   If pain medicine is needed, nonsteroidal anti-inflammatory medicines (aspirin and ibuprofen), or other minor pain relievers (acetaminophen), are often advised.  Do not take pain medicine for 7 days before surgery.  Prescription pain relievers may be given, if your caregiver thinks they are needed. Use only as directed and only as much as you need.  Ointments applied to the skin may be helpful.  Corticosteroid injections may be given by your caregiver. These injections should be reserved for the most serious cases, because they may only be given a certain number of times. HEAT AND COLD  Cold treatment (icing) should be applied for 10 to 15 minutes every 2 to 3 hours for inflammation and pain, and immediately after activity that aggravates your symptoms. Use ice packs or an ice massage.  Heat treatment may be used before performing stretching and strengthening activities prescribed by your caregiver, physical therapist, or athletic trainer. Use a heat pack or a warm water soak. SEEK MEDICAL CARE IF:   Symptoms get worse or do not improve in 2  to 4 weeks, despite treatment.  You develop numbness, weakness, or loss of bowel or bladder function.  New, unexplained symptoms develop. (Drugs used in treatment may produce side effects.) EXERCISES  RANGE OF MOTION (ROM) AND STRETCHING EXERCISES - Low Back Strain Most people with lower back pain will find that their symptoms get worse with excessive bending forward (flexion) or arching at the lower back (extension). The exercises which will help resolve your symptoms will focus on the opposite motion.  Your physician, physical therapist or athletic trainer will help you determine which exercises will be most helpful to resolve your lower back pain. Do not complete any exercises without first consulting with your caregiver. Discontinue any exercises which make your symptoms worse until you speak to your caregiver.  If you have pain, numbness or tingling which travels down into your buttocks, leg or foot, the goal of the therapy is for these symptoms to move closer to your back and eventually resolve. Sometimes, these leg symptoms will get better, but your lower back pain may worsen. This is typically an indication of progress in your rehabilitation. Be very alert to any changes in your symptoms and the activities in which you participated in the 24 hours prior to the change. Sharing this information with your caregiver will allow him/her to most efficiently treat your condition.  These exercises may help you when beginning to rehabilitate your injury. Your symptoms may resolve with or without further involvement from your physician, physical therapist or athletic trainer. While completing these exercises, remember:  Restoring tissue flexibility helps normal motion to return to the joints. This allows healthier, less painful movement and activity.  An effective stretch should be held for at least 30 seconds.  A stretch should never be painful. You should only feel a gentle lengthening or release in  the stretched tissue. FLEXION RANGE OF MOTION AND STRETCHING EXERCISES: STRETCH - Flexion, Single Knee to Chest   Lie on a firm bed or floor with both legs  extended in front of you.  Keeping one leg in contact with the floor, bring your opposite knee to your chest. Hold your leg in place by either grabbing behind your thigh or at your knee.  Pull until you feel a gentle stretch in your lower back. Hold __________ seconds.  Slowly release your grasp and repeat the exercise with the opposite side. Repeat __________ times. Complete this exercise __________ times per day.  STRETCH - Flexion, Double Knee to Chest   Lie on a firm bed or floor with both legs extended in front of you.  Keeping one leg in contact with the floor, bring your opposite knee to your chest.  Tense your stomach muscles to support your back and then lift your other knee to your chest. Hold your legs in place by either grabbing behind your thighs or at your knees.  Pull both knees toward your chest until you feel a gentle stretch in your lower back. Hold __________ seconds.  Tense your stomach muscles and slowly return one leg at a time to the floor. Repeat __________ times. Complete this exercise __________ times per day.  STRETCH - Low Trunk Rotation  Lie on a firm bed or floor. Keeping your legs in front of you, bend your knees so they are both pointed toward the ceiling and your feet are flat on the floor.  Extend your arms out to the side. This will stabilize your upper body by keeping your shoulders in contact with the floor.  Gently and slowly drop both knees together to one side until you feel a gentle stretch in your lower back. Hold for __________ seconds.  Tense your stomach muscles to support your lower back as you bring your knees back to the starting position. Repeat the exercise to the other side. Repeat __________ times. Complete this exercise __________ times per day  EXTENSION RANGE OF MOTION AND  FLEXIBILITY EXERCISES: STRETCH - Extension, Prone on Elbows   Lie on your stomach on the floor, a bed will be too soft. Place your palms about shoulder width apart and at the height of your head.  Place your elbows under your shoulders. If this is too painful, stack pillows under your chest.  Allow your body to relax so that your hips drop lower and make contact more completely with the floor.  Hold this position for __________ seconds.  Slowly return to lying flat on the floor. Repeat __________ times. Complete this exercise __________ times per day.  RANGE OF MOTION - Extension, Prone Press Ups  Lie on your stomach on the floor, a bed will be too soft. Place your palms about shoulder width apart and at the height of your head.  Keeping your back as relaxed as possible, slowly straighten your elbows while keeping your hips on the floor. You may adjust the placement of your hands to maximize your comfort. As you gain motion, your hands will come more underneath your shoulders.  Hold this position __________ seconds.  Slowly return to lying flat on the floor. Repeat __________ times. Complete this exercise __________ times per day.  RANGE OF MOTION- Quadruped, Neutral Spine   Assume a hands and knees position on a firm surface. Keep your hands under your shoulders and your knees under your hips. You may place padding under your knees for comfort.  Drop your head and point your tail bone toward the ground below you. This will round out your lower back like an angry cat. Hold this position for  __________ seconds.  Slowly lift your head and release your tail bone so that your back sags into a large arch, like an old horse.  Hold this position for __________ seconds.  Repeat this until you feel limber in your lower back.  Now, find your "sweet spot." This will be the most comfortable position somewhere between the two previous positions. This is your neutral spine. Once you have found  this position, tense your stomach muscles to support your lower back.  Hold this position for __________ seconds. Repeat __________ times. Complete this exercise __________ times per day.  STRENGTHENING EXERCISES - Low Back Strain These exercises may help you when beginning to rehabilitate your injury. These exercises should be done near your "sweet spot." This is the neutral, low-back arch, somewhere between fully rounded and fully arched, that is your least painful position. When performed in this safe range of motion, these exercises can be used for people who have either a flexion or extension based injury. These exercises may resolve your symptoms with or without further involvement from your physician, physical therapist or athletic trainer. While completing these exercises, remember:   Muscles can gain both the endurance and the strength needed for everyday activities through controlled exercises.  Complete these exercises as instructed by your physician, physical therapist or athletic trainer. Increase the resistance and repetitions only as guided.  You may experience muscle soreness or fatigue, but the pain or discomfort you are trying to eliminate should never worsen during these exercises. If this pain does worsen, stop and make certain you are following the directions exactly. If the pain is still present after adjustments, discontinue the exercise until you can discuss the trouble with your caregiver. STRENGTHENING - Deep Abdominals, Pelvic Tilt  Lie on a firm bed or floor. Keeping your legs in front of you, bend your knees so they are both pointed toward the ceiling and your feet are flat on the floor.  Tense your lower abdominal muscles to press your lower back into the floor. This motion will rotate your pelvis so that your tail bone is scooping upwards rather than pointing at your feet or into the floor.  With a gentle tension and even breathing, hold this position for __________  seconds. Repeat __________ times. Complete this exercise __________ times per day.  STRENGTHENING - Abdominals, Crunches   Lie on a firm bed or floor. Keeping your legs in front of you, bend your knees so they are both pointed toward the ceiling and your feet are flat on the floor. Cross your arms over your chest.  Slightly tip your chin down without bending your neck.  Tense your abdominals and slowly lift your trunk high enough to just clear your shoulder blades. Lifting higher can put excessive stress on the lower back and does not further strengthen your abdominal muscles.  Control your return to the starting position. Repeat __________ times. Complete this exercise __________ times per day.  STRENGTHENING - Quadruped, Opposite UE/LE Lift   Assume a hands and knees position on a firm surface. Keep your hands under your shoulders and your knees under your hips. You may place padding under your knees for comfort.  Find your neutral spine and gently tense your abdominal muscles so that you can maintain this position. Your shoulders and hips should form a rectangle that is parallel with the floor and is not twisted.  Keeping your trunk steady, lift your right hand no higher than your shoulder and then your left leg  no higher than your hip. Make sure you are not holding your breath. Hold this position __________ seconds.  Continuing to keep your abdominal muscles tense and your back steady, slowly return to your starting position. Repeat with the opposite arm and leg. Repeat __________ times. Complete this exercise __________ times per day.  STRENGTHENING - Lower Abdominals, Double Knee Lift  Lie on a firm bed or floor. Keeping your legs in front of you, bend your knees so they are both pointed toward the ceiling and your feet are flat on the floor.  Tense your abdominal muscles to brace your lower back and slowly lift both of your knees until they come over your hips. Be certain not to hold  your breath.  Hold __________ seconds. Using your abdominal muscles, return to the starting position in a slow and controlled manner. Repeat __________ times. Complete this exercise __________ times per day.  POSTURE AND BODY MECHANICS CONSIDERATIONS - Low Back Strain Keeping correct posture when sitting, standing or completing your activities will reduce the stress put on different body tissues, allowing injured tissues a chance to heal and limiting painful experiences. The following are general guidelines for improved posture. Your physician or physical therapist will provide you with any instructions specific to your needs. While reading these guidelines, remember:  The exercises prescribed by your provider will help you have the flexibility and strength to maintain correct postures.  The correct posture provides the best environment for your joints to work. All of your joints have less wear and tear when properly supported by a spine with good posture. This means you will experience a healthier, less painful body.  Correct posture must be practiced with all of your activities, especially prolonged sitting and standing. Correct posture is as important when doing repetitive low-stress activities (typing) as it is when doing a single heavy-load activity (lifting). RESTING POSITIONS Consider which positions are most painful for you when choosing a resting position. If you have pain with flexion-based activities (sitting, bending, stooping, squatting), choose a position that allows you to rest in a less flexed posture. You would want to avoid curling into a fetal position on your side. If your pain worsens with extension-based activities (prolonged standing, working overhead), avoid resting in an extended position such as sleeping on your stomach. Most people will find more comfort when they rest with their spine in a more neutral position, neither too rounded nor too arched. Lying on a non-sagging bed  on your side with a pillow between your knees, or on your back with a pillow under your knees will often provide some relief. Keep in mind, being in any one position for a prolonged period of time, no matter how correct your posture, can still lead to stiffness. PROPER SITTING POSTURE In order to minimize stress and discomfort on your spine, you must sit with correct posture. Sitting with good posture should be effortless for a healthy body. Returning to good posture is a gradual process. Many people can work toward this most comfortably by using various supports until they have the flexibility and strength to maintain this posture on their own. When sitting with proper posture, your ears will fall over your shoulders and your shoulders will fall over your hips. You should use the back of the chair to support your upper back. Your lower back will be in a neutral position, just slightly arched. You may place a small pillow or folded towel at the base of your lower back for support.  When working at a desk, create an environment that supports good, upright posture. Without extra support, muscles tire, which leads to excessive strain on joints and other tissues. Keep these recommendations in mind: CHAIR:  A chair should be able to slide under your desk when your back makes contact with the back of the chair. This allows you to work closely.  The chair's height should allow your eyes to be level with the upper part of your monitor and your hands to be slightly lower than your elbows. BODY POSITION  Your feet should make contact with the floor. If this is not possible, use a foot rest.  Keep your ears over your shoulders. This will reduce stress on your neck and lower back. INCORRECT SITTING POSTURES  If you are feeling tired and unable to assume a healthy sitting posture, do not slouch or slump. This puts excessive strain on your back tissues, causing more damage and pain. Healthier options  include:  Using more support, like a lumbar pillow.  Switching tasks to something that requires you to be upright or walking.  Talking a brief walk.  Lying down to rest in a neutral-spine position. PROLONGED STANDING WHILE SLIGHTLY LEANING FORWARD  When completing a task that requires you to lean forward while standing in one place for a long time, place either foot up on a stationary 2-4 inch high object to help maintain the best posture. When both feet are on the ground, the lower back tends to lose its slight inward curve. If this curve flattens (or becomes too large), then the back and your other joints will experience too much stress, tire more quickly, and can cause pain. CORRECT STANDING POSTURES Proper standing posture should be assumed with all daily activities, even if they only take a few moments, like when brushing your teeth. As in sitting, your ears should fall over your shoulders and your shoulders should fall over your hips. You should keep a slight tension in your abdominal muscles to brace your spine. Your tailbone should point down to the ground, not behind your body, resulting in an over-extended swayback posture.  INCORRECT STANDING POSTURES  Common incorrect standing postures include a forward head, locked knees and/or an excessive swayback. WALKING Walk with an upright posture. Your ears, shoulders and hips should all line-up. PROLONGED ACTIVITY IN A FLEXED POSITION When completing a task that requires you to bend forward at your waist or lean over a low surface, try to find a way to stabilize 3 out of 4 of your limbs. You can place a hand or elbow on your thigh or rest a knee on the surface you are reaching across. This will provide you more stability so that your muscles do not fatigue as quickly. By keeping your knees relaxed, or slightly bent, you will also reduce stress across your lower back. CORRECT LIFTING TECHNIQUES DO :   Assume a wide stance. This will provide  you more stability and the opportunity to get as close as possible to the object which you are lifting.  Tense your abdominals to brace your spine. Bend at the knees and hips. Keeping your back locked in a neutral-spine position, lift using your leg muscles. Lift with your legs, keeping your back straight.  Test the weight of unknown objects before attempting to lift them.  Try to keep your elbows locked down at your sides in order get the best strength from your shoulders when carrying an object.  Always ask for help when  lifting heavy or awkward objects. INCORRECT LIFTING TECHNIQUES DO NOT:   Lock your knees when lifting, even if it is a small object.  Bend and twist. Pivot at your feet or move your feet when needing to change directions.  Assume that you can safely pick up even a paper clip without proper posture. Document Released: 07/28/2005 Document Revised: 10/20/2011 Document Reviewed: 11/09/2008 [facility] Patient Information 2015 [place], [place]. This information is not intended to replace advice given to you by your health care provider. Make sure you discuss any questions you have with your health care provider.

## 2014-06-18 NOTE — ED Notes (Signed)
Pt c/o back pain that radiates down both legs for couple weeks.  Pt sits down for long periods of day at a [employer].

## 2014-06-29 ENCOUNTER — Emergency Department (HOSPITAL_COMMUNITY)
Admission: EM | Admit: 2014-06-29 | Discharge: 2014-06-29 | Discharge status: Absent without leave | Payer: BC Managed Care – PPO | Attending: Emergency Medicine | Admitting: Emergency Medicine

## 2015-05-14 IMAGING — CR DG LUMBAR SPINE COMPLETE 4+V
5 series · 5 of 5 positions shown · non-contrast
Comparison: CT abdomen/ pelvis 07/18/2013

CLINICAL DATA: 33-year-old male with lower back pain radiating into
the bilateral legs. No known injury.

EXAM:
LUMBAR SPINE - COMPLETE 4+ VIEW

[t lumbar spine ap]
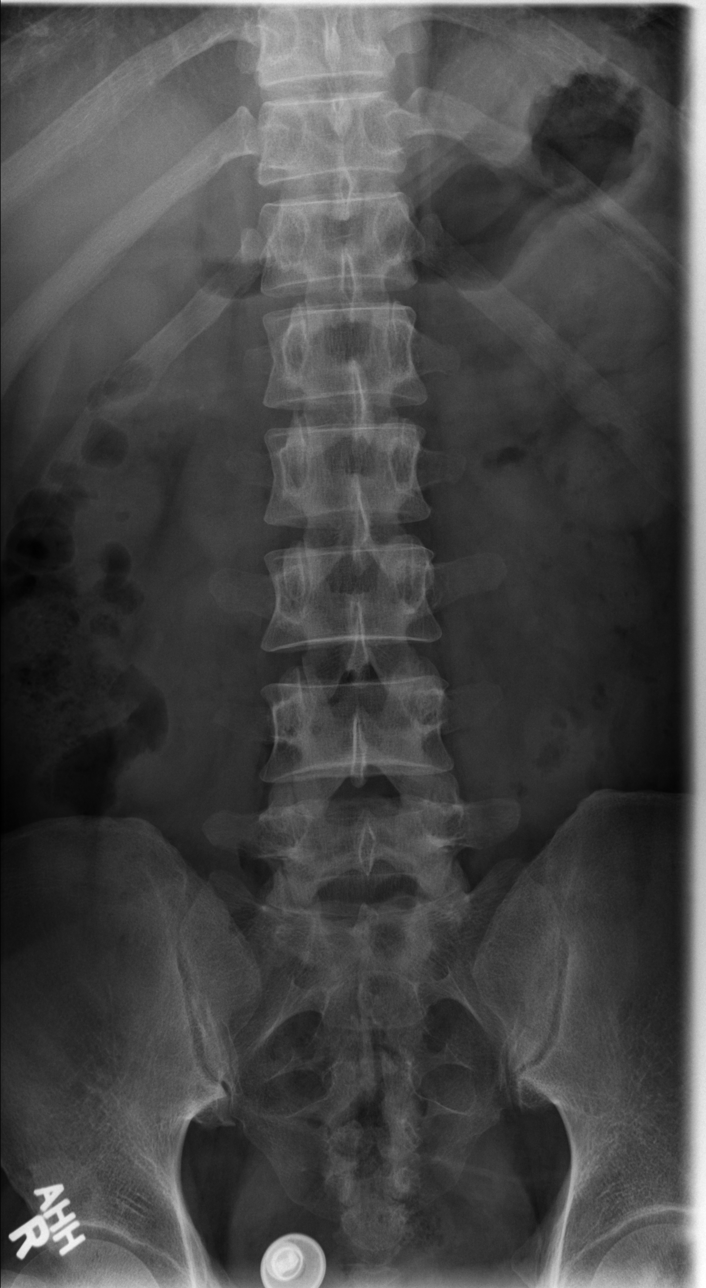

[t lumbar spine obl (1 of 2)]
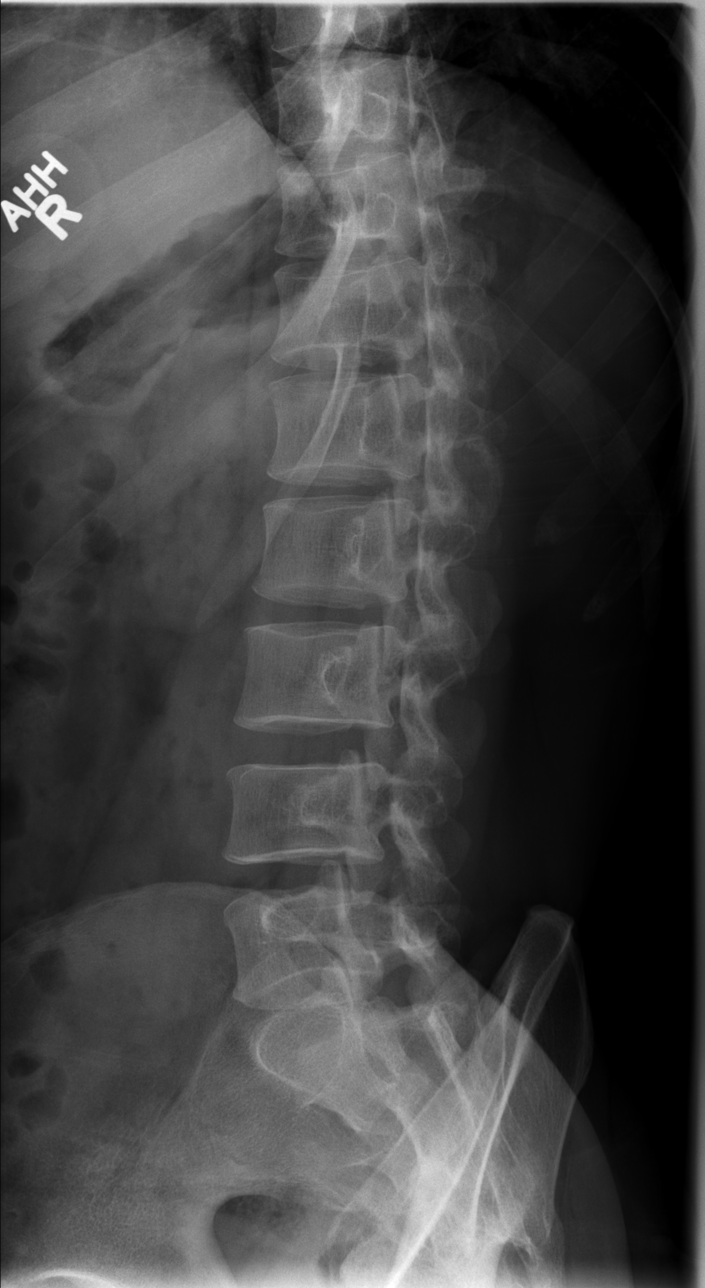

[t lumbar spine obl (2 of 2)]
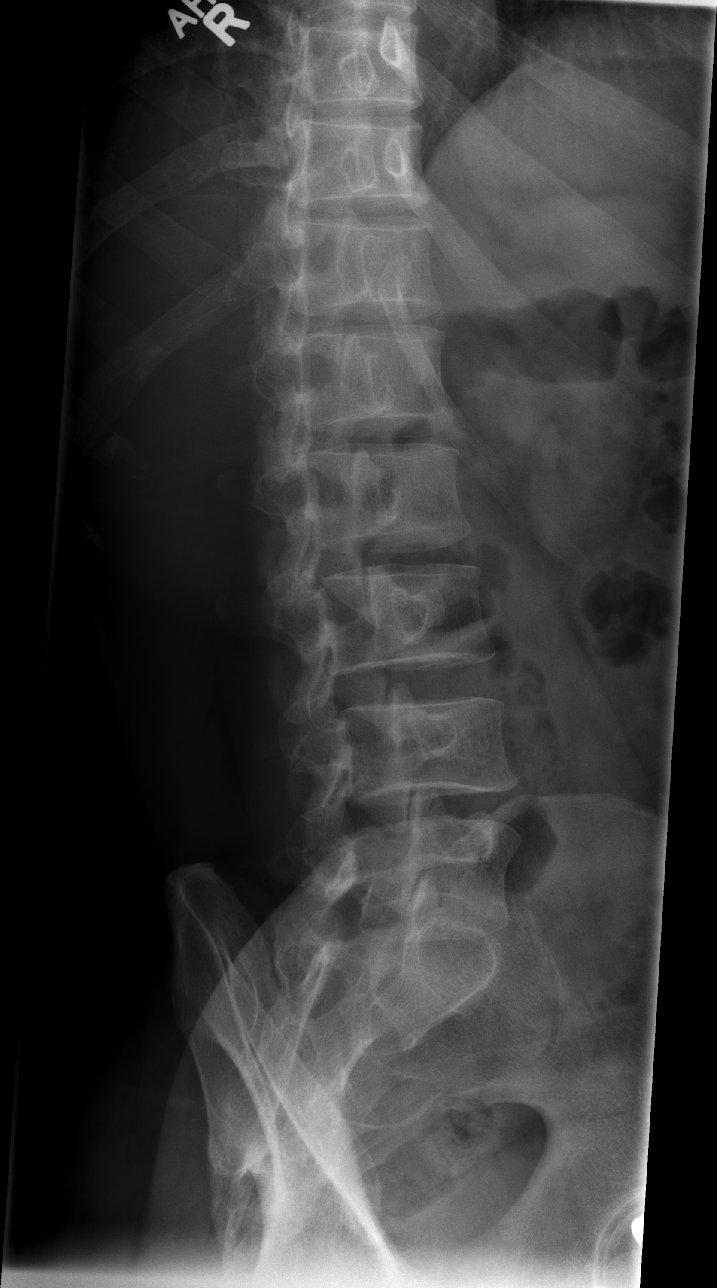

[t lumbar spine lat]
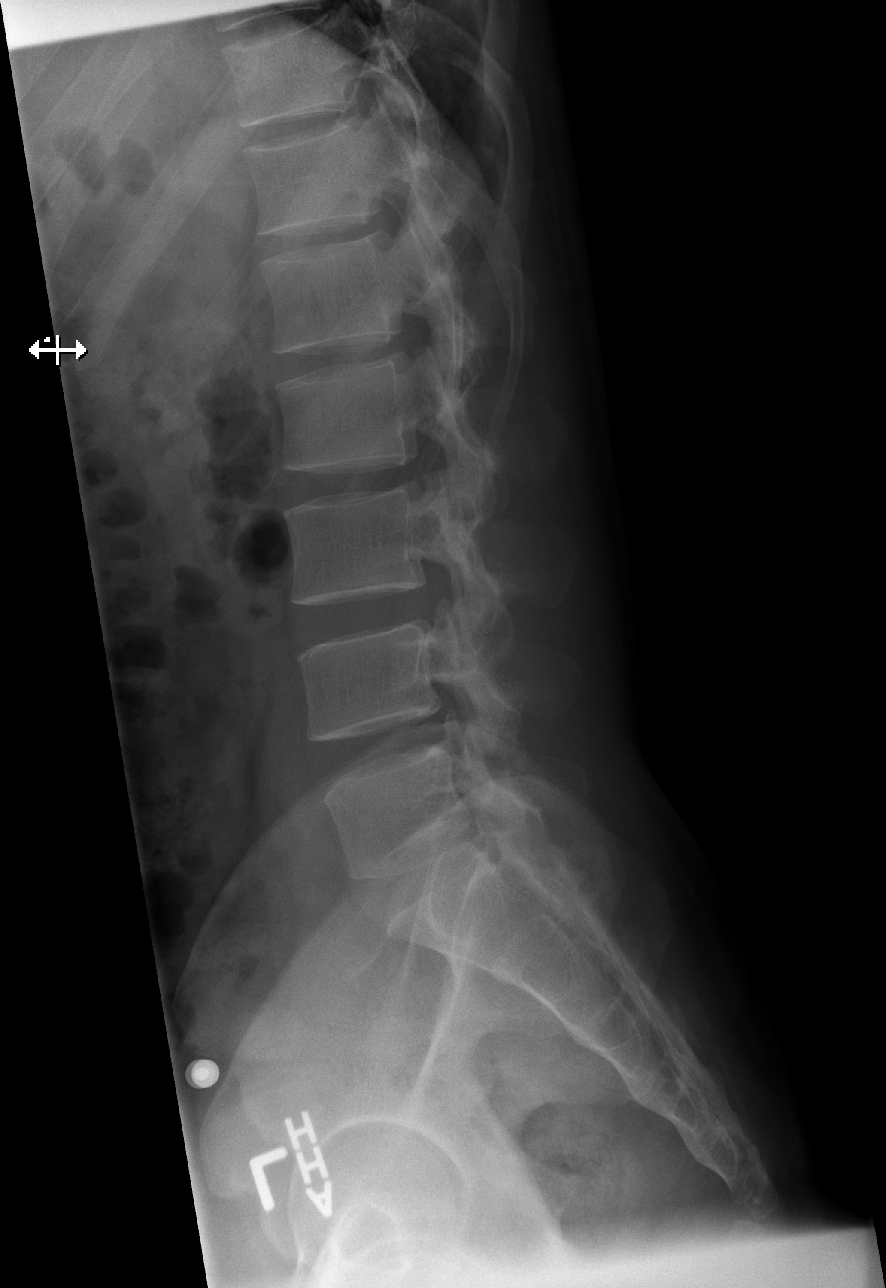

[t lumbar l-5 s-1 spot]
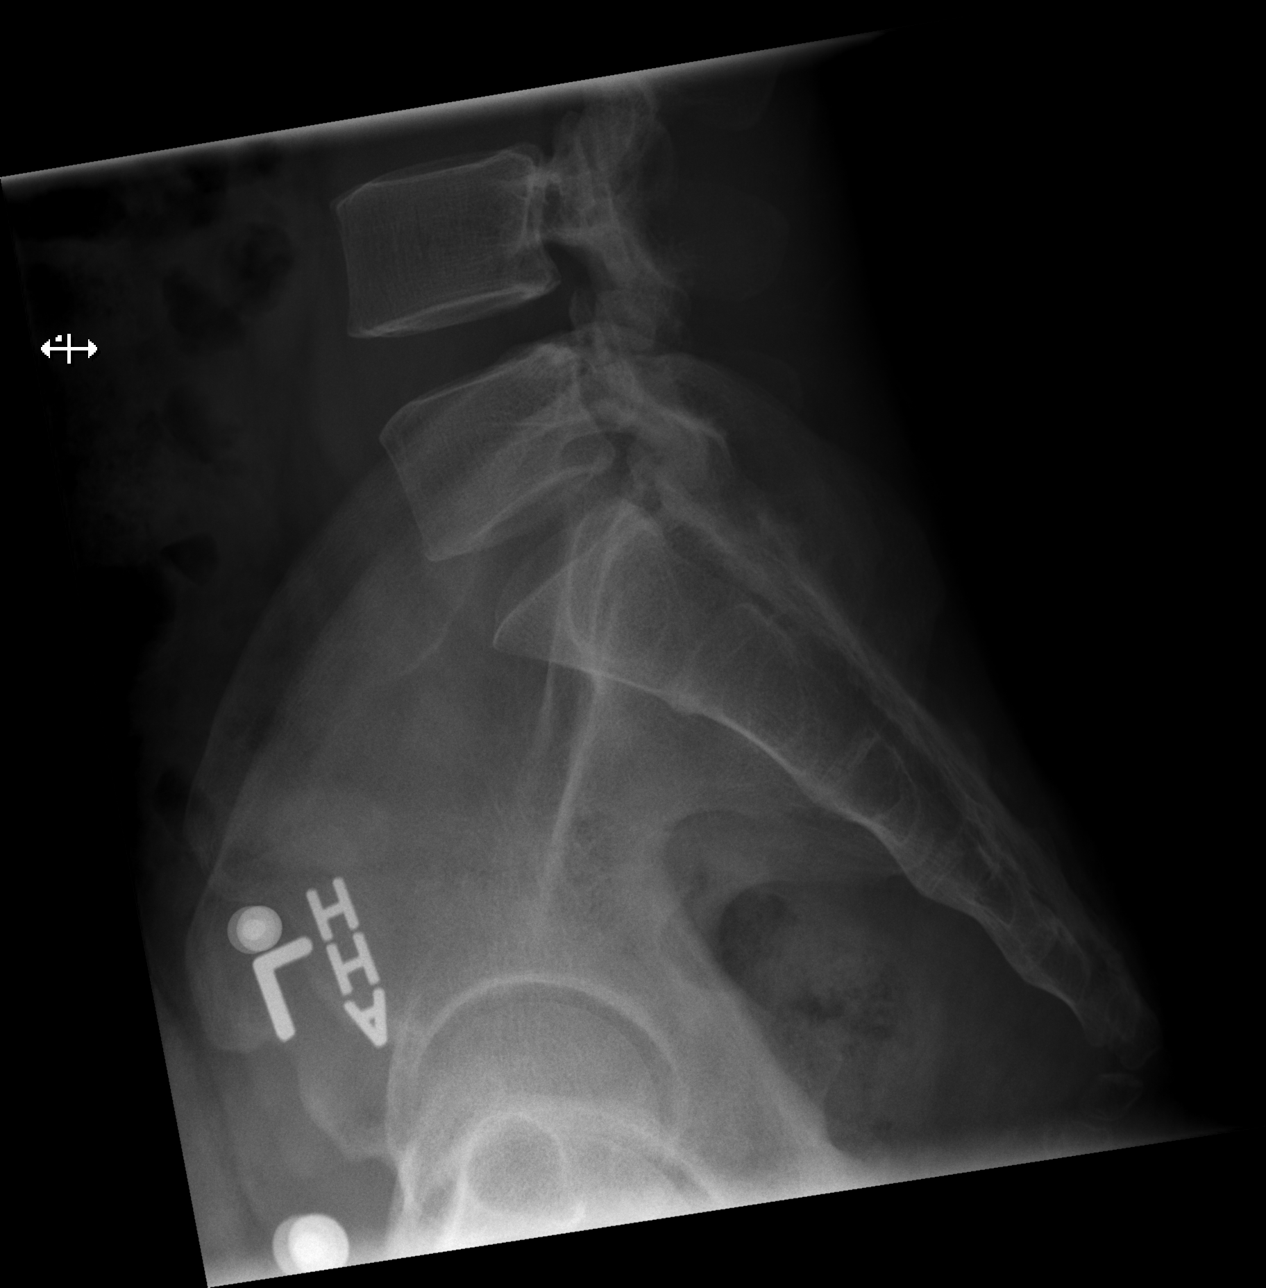

[5 of 5 positions shown; findings below may reference images not displayed]

FINDINGS: No evidence of acute fracture or malalignment. Vertebral body
heights and intervertebral disc spaces are maintained. There is
straightening of the normal lumbar lordosis. Normal bony
mineralization. No lytic of blastic osseous lesion. No focal soft
tissue abnormality. Visualized bowel gas pattern is unremarkable.
IMPRESSION: 1. No evidence of acute osseous abnormality or significant
degenerative change.
2. Straightening of the normal lumbar lordosis may be positional or
related to underlying muscle spasm.

## 2023-04-16 ENCOUNTER — Other Ambulatory Visit (HOSPITAL_BASED_OUTPATIENT_CLINIC_OR_DEPARTMENT_OTHER): Payer: Self-pay | Admitting: Physician Assistant

## 2023-04-16 DIAGNOSIS — R1013 Epigastric pain: Secondary | ICD-10-CM

## 2023-04-21 ENCOUNTER — Ambulatory Visit (HOSPITAL_BASED_OUTPATIENT_CLINIC_OR_DEPARTMENT_OTHER)
Admission: RE | Admit: 2023-04-21 | Discharge: 2023-04-21 | Disposition: A | Discharge status: Home / Self Care | Payer: PRIVATE HEALTH INSURANCE | Source: Ambulatory Visit | Attending: Physician Assistant | Admitting: Physician Assistant

## 2023-04-21 DIAGNOSIS — R1013 Epigastric pain: Secondary | ICD-10-CM | POA: Insufficient documentation
